# Patient Record
Sex: Male | Born: 1990 | Race: Black or African American | Hispanic: No | Marital: Single | Smoking: Never smoker
Health system: Southern US, Community
[De-identification: ages and names within clinical notes are randomized; demographics above are authoritative.]

## PROBLEM LIST (undated history)

## (undated) DIAGNOSIS — S83004A Unspecified dislocation of right patella, initial encounter: Secondary | ICD-10-CM

---
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
## 2016-03-30 ENCOUNTER — Ambulatory Visit
Admission: RE | Admit: 2016-03-30 | Discharge: 2016-03-30 | Disposition: A | Discharge status: Home / Self Care | Payer: BLUE CROSS/BLUE SHIELD | Source: Ambulatory Visit | Attending: Physician Assistant | Admitting: Physician Assistant

## 2016-03-30 ENCOUNTER — Other Ambulatory Visit: Payer: Self-pay | Admitting: Physician Assistant

## 2016-03-30 DIAGNOSIS — M25569 Pain in unspecified knee: Secondary | ICD-10-CM | POA: Diagnosis not present

## 2016-03-30 DIAGNOSIS — R52 Pain, unspecified: Secondary | ICD-10-CM

## 2016-03-30 DIAGNOSIS — M25561 Pain in right knee: Secondary | ICD-10-CM | POA: Diagnosis not present

## 2016-07-05 DIAGNOSIS — D492 Neoplasm of unspecified behavior of bone, soft tissue, and skin: Secondary | ICD-10-CM | POA: Diagnosis not present

## 2016-07-17 DIAGNOSIS — D1801 Hemangioma of skin and subcutaneous tissue: Secondary | ICD-10-CM | POA: Diagnosis not present

## 2017-03-09 ENCOUNTER — Ambulatory Visit (HOSPITAL_COMMUNITY)
Admission: EM | Admit: 2017-03-09 | Discharge: 2017-03-09 | Disposition: A | Discharge status: Home / Self Care | Payer: BLUE CROSS/BLUE SHIELD | Attending: Internal Medicine | Admitting: Internal Medicine

## 2017-03-09 ENCOUNTER — Encounter (HOSPITAL_COMMUNITY): Payer: Self-pay | Admitting: Emergency Medicine

## 2017-03-09 DIAGNOSIS — R0789 Other chest pain: Secondary | ICD-10-CM

## 2017-03-09 MED ORDER — NAPROXEN 500 MG PO TABS: 500.0000 mg | ORAL_TABLET | Freq: Two times a day (BID) | ORAL | 0 refills | Status: AC

## 2017-03-09 NOTE — Discharge Instructions (Signed)
Your symptoms could be due to muscle strain. Start Naproxen as directed. Ice/heat compresses as needed. This can take up to 3-4 weeks to completely resolve, but you should be feeling better each week. Symptoms could also be due to anxiety, establish PCP care for further evaluation and treatment needed.

## 2017-03-09 NOTE — ED Triage Notes (Signed)
Pt here for intermittent centered CP that radiates to the left side onset 3 days  Nothing makes the pain worse  Denies n/v, diaphoresis, HA, blurred vision, weakness, inj/trauma  Lifts heavy obj at work   Going through family stressors at home.   A&O x4... NAD... Ambulatory

## 2017-03-09 NOTE — ED Provider Notes (Signed)
[facility]    CSN: [id number] Arrival date & time: 03/09/17  1523     History   Chief Complaint Chief Complaint  Patient presents with  . Chest Pain    HPI Gary Townsend is a 26 y.o. male.   26 year old healthy male comes in with 3 day history of left sided chest pain that radiates to the center of the chest and migrates back and forth. Patient states pain started while having heated conversation with family member. Pain is intermittent and describes the pain as pins and needles, and feels that he needs to hyperventilate. Slowing his breathing with deep breaths helps with his symptoms. Patient lifts heavy objects for work and states has needed more effort with the left arm to move objects. Denies exacerbation of chest pain with heavy lifting or eating. Denies shortness of breath, palpitations, weakness, dizziness, syncope. Denies abdominal pain, nausea, vomiting. Felt anxious due to family conditions. Denies family history of heart disease, hypertension, diabetes.       History reviewed. No pertinent past medical history.  There are no active problems to display for this patient.   History reviewed. No pertinent surgical history.     Home Medications    Prior to Admission medications   Medication Sig Start Date End Date Taking? Authorizing Provider  naproxen (NAPROSYN) 500 MG tablet Take 1 tablet (500 mg total) by mouth 2 (two) times daily. 03/09/17 03/19/17  Belinda Fisher, PA-C    Family History History reviewed. No pertinent family history.  Social History Social History  Substance Use Topics  . Smoking status: Never Smoker  . Smokeless tobacco: Never Used  . Alcohol use No     Allergies   Patient has no known allergies.   Review of Systems Review of Systems  Reason unable to perform ROS: See HPI as above.     Physical Exam Triage Vital Signs ED Triage Vitals [03/09/17 1613]  Enc Vitals Group     BP 117/69     Pulse Rate 73     Resp 20   Temp 99.1 F (37.3 C)     Temp Source Oral     SpO2 100 %     Weight      Height      Head Circumference      Peak Flow      Pain Score      Pain Loc      Pain Edu?      Excl. in GC?    No data found.   Updated Vital Signs BP 117/69 (BP Location: Left Arm)   Pulse 73   Temp 99.1 F (37.3 C) (Oral)   Resp 20   SpO2 100%   Physical Exam  Constitutional: He is oriented to person, place, and time. He appears well-developed and well-nourished. No distress.  HENT:  Head: Normocephalic and atraumatic.  Eyes: Pupils are equal, round, and reactive to light. Conjunctivae are normal.  Cardiovascular: Normal rate, regular rhythm and normal heart sounds.  Exam reveals no gallop and no friction rub.   No murmur heard. Pulmonary/Chest: Effort normal and breath sounds normal. He has no wheezes. He has no rales. He exhibits tenderness (Left chest tenderness on palpation.).  Musculoskeletal:  No tenderness on palpation of the shoulder, elbow.Full range of motion. Strength normal and equal bilaterally. Sensation intact and equal bilaterally.  Radial pulses 2+ and equal bilaterally. Capillary refill less than 2 seconds.   Neurological: He is alert and oriented to  person, place, and time. He has normal strength. He is not disoriented. No sensory deficit. GCS eye subscore is 4. GCS verbal subscore is 5. GCS motor subscore is 6.  Skin: Skin is warm and dry.     UC Treatments / Results  Labs (all labs ordered are listed, but only abnormal results are displayed) Labs Reviewed - No data to display  EKG  EKG Interpretation None       Radiology No results found.  Procedures Procedures (including critical care time)  Medications Ordered in UC Medications - No data to display   Initial Impression / Assessment and Plan / UC Course  I have reviewed the triage vital signs and the nursing notes.  Pertinent labs & imaging results that were available during my care of the patient were  reviewed by me and considered in my medical decision making (see chart for details).    Discussed with patient given chest pain is reproducible by palpation, likely due to muscle strain from heavy lifting. Discussed with patient symptoms could also be caused by anxiety given onset of symptoms was heated discussion. Start NSAID as directed for pain and inflammation. Ice/heat compresses. Discussed with patient strain can take up to 3-4 weeks to resolve, but should be getting better each week. Patient to establish PCP For further management of anxiety as needed. Resources given, information on anxiety given. Return precautions given.   Final Clinical Impressions(s) / UC Diagnoses   Final diagnoses:  Chest wall pain    New Prescriptions New Prescriptions   NAPROXEN (NAPROSYN) 500 MG TABLET    Take 1 tablet (500 mg total) by mouth 2 (two) times daily.       Belinda Fisher, PA-C 03/09/17 1711

## 2017-07-21 DIAGNOSIS — N451 Epididymitis: Secondary | ICD-10-CM | POA: Diagnosis not present

## 2017-07-21 DIAGNOSIS — N50812 Left testicular pain: Secondary | ICD-10-CM | POA: Diagnosis not present

## 2017-07-23 DIAGNOSIS — N451 Epididymitis: Secondary | ICD-10-CM | POA: Diagnosis not present

## 2017-12-07 DIAGNOSIS — L980 Pyogenic granuloma: Secondary | ICD-10-CM | POA: Diagnosis not present

## 2017-12-25 DIAGNOSIS — Z23 Encounter for immunization: Secondary | ICD-10-CM | POA: Diagnosis not present

## 2017-12-25 DIAGNOSIS — Z0000 Encounter for general adult medical examination without abnormal findings: Secondary | ICD-10-CM | POA: Diagnosis not present

## 2017-12-25 DIAGNOSIS — Z13220 Encounter for screening for lipoid disorders: Secondary | ICD-10-CM | POA: Diagnosis not present

## 2017-12-25 DIAGNOSIS — Z131 Encounter for screening for diabetes mellitus: Secondary | ICD-10-CM | POA: Diagnosis not present

## 2018-01-23 DIAGNOSIS — L980 Pyogenic granuloma: Secondary | ICD-10-CM | POA: Diagnosis not present

## 2018-04-03 DIAGNOSIS — B349 Viral infection, unspecified: Secondary | ICD-10-CM | POA: Diagnosis not present

## 2018-04-03 DIAGNOSIS — J029 Acute pharyngitis, unspecified: Secondary | ICD-10-CM | POA: Diagnosis not present

## 2018-08-01 DIAGNOSIS — J029 Acute pharyngitis, unspecified: Secondary | ICD-10-CM | POA: Diagnosis not present

## 2018-08-01 DIAGNOSIS — R509 Fever, unspecified: Secondary | ICD-10-CM | POA: Diagnosis not present

## 2018-08-01 DIAGNOSIS — R52 Pain, unspecified: Secondary | ICD-10-CM | POA: Diagnosis not present

## 2018-10-02 DIAGNOSIS — L980 Pyogenic granuloma: Secondary | ICD-10-CM | POA: Diagnosis not present

## 2019-02-05 ENCOUNTER — Emergency Department
Admission: EM | Admit: 2019-02-05 | Discharge: 2019-02-05 | Disposition: A | Discharge status: Home / Self Care | Payer: BC Managed Care – PPO | Source: Home / Self Care

## 2019-02-05 ENCOUNTER — Other Ambulatory Visit: Payer: Self-pay

## 2019-02-05 ENCOUNTER — Emergency Department (INDEPENDENT_AMBULATORY_CARE_PROVIDER_SITE_OTHER): Payer: BC Managed Care – PPO

## 2019-02-05 ENCOUNTER — Encounter: Payer: Self-pay | Admitting: *Deleted

## 2019-02-05 DIAGNOSIS — S91115A Laceration without foreign body of left lesser toe(s) without damage to nail, initial encounter: Secondary | ICD-10-CM | POA: Diagnosis not present

## 2019-02-05 DIAGNOSIS — M79675 Pain in left toe(s): Secondary | ICD-10-CM

## 2019-02-05 DIAGNOSIS — S99922A Unspecified injury of left foot, initial encounter: Secondary | ICD-10-CM | POA: Diagnosis not present

## 2019-02-05 DIAGNOSIS — M79672 Pain in left foot: Secondary | ICD-10-CM

## 2019-02-05 DIAGNOSIS — S93505A Unspecified sprain of left lesser toe(s), initial encounter: Secondary | ICD-10-CM

## 2019-02-05 NOTE — Discharge Instructions (Signed)
°  You may take 500mg  acetaminophen every 4-6 hours or in combination with ibuprofen 400-600mg  every 6-8 hours as needed for pain and inflammation.  You should change your bandage 2-3 times daily but can keep your bandage off if at home in your house.  Keep wound clean and dry. You may buddy tape your two toes together to help with pain.  Please follow up with Sports Medicine in 1-2 weeks if not improving.

## 2019-02-05 NOTE — ED Triage Notes (Signed)
Pt c/o LT 3 rd toe pain x 1 day after hitting it on the stairwell at home.

## 2019-02-05 NOTE — ED Provider Notes (Signed)
[facility]    CSN: [id number] Arrival date & time: 02/05/19  1112      History   Chief Complaint Chief Complaint  Patient presents with   Foot Pain    HPI Gary Townsend is a 28 y.o. male.   HPI  Gary Townsend is a 28 y.o. male presenting to UC with c/o Left middle toe pain and wound that occurred last night after hitting his Left third toe on his stairs last night.  He notes his toe "popped out" and he was able to "pop it back in" but has continued to have pain/soreness. He has to wear steel toed shoes at work, which makes the wound hurt more despite wearing two bandages on his toe. The nurse at work encouraged him to be seen today.  No medication taken PTA>    History reviewed. No pertinent past medical history.  There are no active problems to display for this patient.   History reviewed. No pertinent surgical history.     Home Medications    Prior to Admission medications   Not on File    Family History History reviewed. No pertinent family history.  Social History Social History   Tobacco Use   Smoking status: Never Smoker   Smokeless tobacco: Never Used  Substance Use Topics   Alcohol use: No   Drug use: No     Allergies   Patient has no known allergies.   Review of Systems Review of Systems  Musculoskeletal: Positive for arthralgias. Negative for joint swelling.  Skin: Positive for wound. Negative for color change.  Neurological: Negative for weakness and numbness.     Physical Exam Triage Vital Signs ED Triage Vitals  Enc Vitals Group     BP      Pulse      Resp      Temp      Temp src      SpO2      Weight      Height      Head Circumference      Peak Flow      Pain Score      Pain Loc      Pain Edu?      Excl. in GC?    No data found.  Updated Vital Signs BP 105/70 (BP Location: Right Arm)    Pulse 76    Temp 98.3 F (36.8 C) (Oral)    Resp 18    Ht 5\' 7"  (1.702 m)    Wt 208 lb (94.3 kg)    SpO2 98%     BMI 32.58 kg/m   Visual Acuity Right Eye Distance:   Left Eye Distance:   Bilateral Distance:    Right Eye Near:   Left Eye Near:    Bilateral Near:     Physical Exam Vitals signs and nursing note reviewed.  Constitutional:      Appearance: Normal appearance. He is well-developed.  HENT:     Head: Normocephalic and atraumatic.  Neck:     Musculoskeletal: Normal range of motion.  Cardiovascular:     Rate and Rhythm: Normal rate.  Pulmonary:     Effort: Pulmonary effort is normal.  Musculoskeletal: Normal range of motion.        General: Tenderness present. No swelling or deformity.     Comments: Left middle to: mild tenderness, no edema or deformity. Full ROM  Skin:    General: Skin is warm and dry.  Capillary Refill: Capillary refill takes less than 2 seconds.     Comments: Left middle toe: superficial laceration to tip of toe. No nailbed involvement. No active bleeding. No foreign bodies seen or palpated.   Neurological:     Mental Status: He is alert and oriented to person, place, and time.  Psychiatric:        Behavior: Behavior normal.      UC Treatments / Results  Labs (all labs ordered are listed, but only abnormal results are displayed) Labs Reviewed - No data to display  EKG   Radiology Dg Foot Complete Left  Result Date: 02/05/2019 CLINICAL DATA:  Pt hit his left 3rd toe on the stair last night and states he popped back in the joint EXAM: LEFT FOOT - COMPLETE 3+ VIEW COMPARISON:  None. FINDINGS: There is no evidence of fracture or dislocation. Normal alignment. There is no evidence of arthropathy or other focal bone abnormality. Soft tissues are unremarkable. IMPRESSION: No evidence of fracture or dislocation in the left foot, specifically the third toe. Electronically Signed   By: Audie Pinto M.D.   On: 02/05/2019 11:49    Procedures Procedures (including critical care time)  Medications Ordered in UC Medications - No data to  display  Initial Impression / Assessment and Plan / UC Course  I have reviewed the triage vital signs and the nursing notes.  Pertinent labs & imaging results that were available during my care of the patient were reviewed by me and considered in my medical decision making (see chart for details).     Reviewed imaging with pt. Reassured no fracture or dislocation Wound does not need closing. Wound re-bandaged.  Due to having to wear steel toed shoes, work note provided for today and tomorrow to allow toe to heal a little more AVS provided.  Final Clinical Impressions(s) / UC Diagnoses   Final diagnoses:  Laceration of third toe of left foot, initial encounter  Toe pain, left  Sprain of third toe of left foot, initial encounter     Discharge Instructions      You may take 500mg  acetaminophen every 4-6 hours or in combination with ibuprofen 400-600mg  every 6-8 hours as needed for pain and inflammation.  You should change your bandage 2-3 times daily but can keep your bandage off if at home in your house.  Keep wound clean and dry. You may buddy tape your two toes together to help with pain.  Please follow up with Sports Medicine in 1-2 weeks if not improving.      ED Prescriptions    None     Controlled Substance Prescriptions [place] Controlled Substance Registry consulted? Not Applicable   Tyrell Antonio 02/05/19 1310

## 2019-02-20 ENCOUNTER — Ambulatory Visit (HOSPITAL_COMMUNITY)
Admission: EM | Admit: 2019-02-20 | Discharge: 2019-02-20 | Disposition: A | Discharge status: Home / Self Care | Payer: BC Managed Care – PPO | Attending: Family Medicine | Admitting: Family Medicine

## 2019-02-20 ENCOUNTER — Encounter (HOSPITAL_COMMUNITY): Payer: Self-pay

## 2019-02-20 ENCOUNTER — Other Ambulatory Visit: Payer: Self-pay

## 2019-02-20 DIAGNOSIS — B349 Viral infection, unspecified: Secondary | ICD-10-CM | POA: Diagnosis not present

## 2019-02-20 DIAGNOSIS — R51 Headache: Secondary | ICD-10-CM

## 2019-02-20 DIAGNOSIS — R0989 Other specified symptoms and signs involving the circulatory and respiratory systems: Secondary | ICD-10-CM

## 2019-02-20 DIAGNOSIS — J029 Acute pharyngitis, unspecified: Secondary | ICD-10-CM

## 2019-02-20 DIAGNOSIS — Z20828 Contact with and (suspected) exposure to other viral communicable diseases: Secondary | ICD-10-CM | POA: Diagnosis not present

## 2019-02-20 DIAGNOSIS — R6889 Other general symptoms and signs: Secondary | ICD-10-CM | POA: Diagnosis not present

## 2019-02-20 DIAGNOSIS — R05 Cough: Secondary | ICD-10-CM

## 2019-02-20 DIAGNOSIS — Z20822 Contact with and (suspected) exposure to covid-19: Secondary | ICD-10-CM

## 2019-02-20 DIAGNOSIS — M791 Myalgia, unspecified site: Secondary | ICD-10-CM

## 2019-02-20 LAB — POCT RAPID STREP A: Streptococcus, Group A Screen (Direct): NEGATIVE

## 2019-02-20 MED ORDER — DEXAMETHASONE 6 MG PO TABS: 6.0000 mg | ORAL_TABLET | Freq: Every day | ORAL | 0 refills | Status: DC

## 2019-02-20 NOTE — ED Provider Notes (Addendum)
[facility]    CSN: [id number] Arrival date & time: 02/20/19  5732      History   Chief Complaint Chief Complaint  Patient presents with  . Sore Throat  . Generalized Body Aches    HPI Gary Townsend is a 28 y.o. male.   Subjective:   Gary Townsend is a 28 y.o. male who presents for evaluation of headache, myalgias, sore throat, cough, chest tightness and dizziness. Symptoms have been present for 1 day. He denies any fevers, chills, nausea, vomiting, diarrhea or headache. He has not tried anything to alleviate his symptoms. He denies any known COVID exposure. His fiance is sick with similar symptoms.   The following portions of the patient's history were reviewed and updated as appropriate: allergies, current medications, past family history, past medical history, past social history, past surgical history and problem list.        History reviewed. No pertinent past medical history.  There are no active problems to display for this patient.   History reviewed. No pertinent surgical history.     Home Medications    Prior to Admission medications   Medication Sig Start Date End Date Taking? Authorizing Provider  dexamethasone (DECADRON) 6 MG tablet Take 1 tablet (6 mg total) by mouth daily. 02/20/19   Lurline Idol, FNP    Family History Family History  Problem Relation Age of Onset  . Healthy Mother   . Healthy Father     Social History Social History   Tobacco Use  . Smoking status: Never Smoker  . Smokeless tobacco: Never Used  Substance Use Topics  . Alcohol use: No  . Drug use: No     Allergies   Patient has no known allergies.   Review of Systems Review of Systems  Constitutional: Negative for appetite change, chills, diaphoresis, fatigue and fever.  HENT: Positive for sore throat.   Respiratory: Positive for cough. Negative for shortness of breath and wheezing.   Cardiovascular: Positive for chest pain. Negative for  palpitations.  Gastrointestinal: Negative for diarrhea, nausea and vomiting.  Musculoskeletal: Positive for myalgias.  Neurological: Positive for dizziness. Negative for headaches.  All other systems reviewed and are negative.    Physical Exam Triage Vital Signs ED Triage Vitals  Enc Vitals Group     BP 02/20/19 0830 110/72     Pulse Rate 02/20/19 0830 84     Resp 02/20/19 0830 16     Temp 02/20/19 0830 99.6 F (37.6 C)     Temp Source 02/20/19 0830 Oral     SpO2 02/20/19 0830 100 %     Weight --      Height --      Head Circumference --      Peak Flow --      Pain Score 02/20/19 0828 2     Pain Loc --      Pain Edu? --      Excl. in GC? --    No data found.  Updated Vital Signs BP 110/72 (BP Location: Right Arm)   Pulse 84   Temp 99.6 F (37.6 C) (Oral)   Resp 16   SpO2 100%   Visual Acuity Right Eye Distance:   Left Eye Distance:   Bilateral Distance:    Right Eye Near:   Left Eye Near:    Bilateral Near:     Physical Exam Vitals signs reviewed.  Constitutional:      Appearance: He is well-developed. He  is not ill-appearing.  HENT:     Head: Normocephalic.     Mouth/Throat:     Lips: Pink.     Mouth: Mucous membranes are moist.     Pharynx: Posterior oropharyngeal erythema present.  Eyes:     Conjunctiva/sclera: Conjunctivae normal.     Pupils: Pupils are equal, round, and reactive to light.  Neck:     Musculoskeletal: Normal range of motion and neck supple.  Cardiovascular:     Rate and Rhythm: Normal rate and regular rhythm.  Pulmonary:     Effort: Pulmonary effort is normal.     Breath sounds: Normal breath sounds.  Lymphadenopathy:     Cervical: No cervical adenopathy.  Skin:    General: Skin is warm and dry.  Neurological:     General: No focal deficit present.     Mental Status: He is alert and oriented to person, place, and time.  Psychiatric:        Mood and Affect: Mood normal.      UC Treatments / Results  Labs (all labs  ordered are listed, but only abnormal results are displayed) Labs Reviewed  NOVEL CORONAVIRUS, NAA (HOSP ORDER, SEND-OUT TO REF LAB; TAT 18-24 HRS)  CULTURE, GROUP A STREP [facility])  POCT RAPID STREP A    EKG   Radiology No results found.  Procedures Procedures (including critical care time)  Medications Ordered in UC Medications - No data to display  Initial Impression / Assessment and Plan / UC Course  I have reviewed the triage vital signs and the nursing notes.  Pertinent labs & imaging results that were available during my care of the patient were reviewed by me and considered in my medical decision making (see chart for details).    28 yo male with no significant medical history presenting with a one-day history of worsening  headache, myalgias, sore throat, cough, chest tightness and dizziness. He denies any known exposure to COVID but his fiance is also sick with similar symptoms. Rapid strep is negative. COVID test pending.   Plan:  Supportive care with appropriate antipyretics and fluids. Take medications as prescribed  Patient should remain in home isolation (quarantine) until notified of their COVID-19 results; further recommendations will be made based upon the results.  The patient should monitor for any developing or worsening symptoms. If serious symptoms develop, the patient should return here for evaluation, contact their health care provider or go to the nearest emergency department immediately.    Today's evaluation has revealed no signs of a dangerous process. Discussed diagnosis with patient and/or guardian. Patient and/or guardian aware of their diagnosis, possible red flag symptoms to watch out for and need for close follow up. Patient and/or guardian understands verbal and written discharge instructions. Patient and/or guardian comfortable with plan and disposition.  Patient and/or guardian has a clear mental status at this time, good insight into illness  (after discussion and teaching) and has clear judgment to make decisions regarding their care  This care was provided during an unprecedented National Emergency due to the Novel Coronavirus (COVID-19) pandemic. COVID-19 infections and transmission risks place heavy strains on healthcare resources.  As this pandemic evolves, our facility, providers, and staff strive to respond fluidly, to remain operational, and to provide care relative to available resources and information. Outcomes are unpredictable and treatments are without well-defined guidelines. Further, the impact of COVID-19 on all aspects of urgent care, including the impact to patients seeking care for reasons other than COVID-19,  is unavoidable during this national emergency. At this time of the global pandemic, management of patients has significantly changed, even for non-COVID positive patients given high local and regional COVID volumes at this time requiring high healthcare system and resource utilization. The standard of care for management of both COVID suspected and non-COVID suspected patients continues to change rapidly at the local, regional, national, and global levels. This patient was worked up and treated to the best available but ever changing evidence and resources available at this current time.   Documentation was completed with the aid of voice recognition software. Transcription may contain typographical errors.   Final Clinical Impressions(s) / UC Diagnoses   Final diagnoses:  Suspected Covid-19 Virus Infection  Viral syndrome     Discharge Instructions     Take medications as prescribed.  Supportive care with tylenol and/or ibuprofen, rest and fluids. You should remain in home isolation (quarantine) until notified of your COVID-19 results. Further recommendations will be made based upon these results. You will only be called if your COVID test is POSITIVE. You should go to MyChart to review results of any testing  done here today. Monitor for any developing or worsening symptoms. If any serious symptoms develop, you should return here for evaluation, contact your health care provider or go to the nearest emergency department immediately.      ED Prescriptions    Medication Sig Dispense Auth. Provider   dexamethasone (DECADRON) 6 MG tablet Take 1 tablet (6 mg total) by mouth daily. 5 tablet Enrique Sack, FNP     Controlled Substance Prescriptions [place] Controlled Substance Registry consulted? Not Applicable   Enrique Sack, [place] 02/20/19 [place], [place], [place] 02/20/19 [phone]

## 2019-02-20 NOTE — ED Triage Notes (Signed)
Patient presents to Urgent Care with complaints of sore throat and generalized body aches since last night. Patient reports fiance has been sick too but with different symptoms.

## 2019-02-20 NOTE — Discharge Instructions (Addendum)
Take medications as prescribed.  Supportive care with tylenol and/or ibuprofen, rest and fluids. You should remain in home isolation (quarantine) until notified of your COVID-19 results. Further recommendations will be made based upon these results. You will only be called if your COVID test is POSITIVE. You should go to MyChart to review results of any testing done here today. Monitor for any developing or worsening symptoms. If any serious symptoms develop, you should return here for evaluation, contact your health care provider or go to the nearest emergency department immediately.

## 2019-02-22 LAB — CULTURE, GROUP A STREP (THRC)

## 2019-02-22 LAB — NOVEL CORONAVIRUS, NAA (HOSP ORDER, SEND-OUT TO REF LAB; TAT 18-24 HRS): SARS-CoV-2, NAA: NOT DETECTED

## 2019-02-24 ENCOUNTER — Encounter (HOSPITAL_COMMUNITY): Payer: Self-pay

## 2019-03-05 ENCOUNTER — Ambulatory Visit (INDEPENDENT_AMBULATORY_CARE_PROVIDER_SITE_OTHER): Payer: BC Managed Care – PPO | Admitting: Family Medicine

## 2019-03-05 ENCOUNTER — Other Ambulatory Visit: Payer: Self-pay

## 2019-03-05 ENCOUNTER — Ambulatory Visit (INDEPENDENT_AMBULATORY_CARE_PROVIDER_SITE_OTHER): Payer: BC Managed Care – PPO

## 2019-03-05 ENCOUNTER — Encounter: Payer: Self-pay | Admitting: Family Medicine

## 2019-03-05 ENCOUNTER — Other Ambulatory Visit: Payer: Self-pay | Admitting: Family Medicine

## 2019-03-05 VITALS — BP 105/69 | HR 69 | Temp 98.6°F | Wt 213.0 lb

## 2019-03-05 DIAGNOSIS — M25561 Pain in right knee: Secondary | ICD-10-CM

## 2019-03-05 DIAGNOSIS — M25562 Pain in left knee: Secondary | ICD-10-CM | POA: Diagnosis not present

## 2019-03-05 DIAGNOSIS — S83001A Unspecified subluxation of right patella, initial encounter: Secondary | ICD-10-CM | POA: Diagnosis not present

## 2019-03-05 MED ORDER — DICLOFENAC SODIUM 1 % TD GEL: 4.0000 g | Freq: Four times a day (QID) | TRANSDERMAL | 11 refills | Status: AC

## 2019-03-05 MED ORDER — NAPROXEN 500 MG PO TABS: 500.0000 mg | ORAL_TABLET | Freq: Two times a day (BID) | ORAL | 1 refills | Status: AC | PRN

## 2019-03-05 NOTE — Progress Notes (Signed)
Subjective:    CC: Right knee pain  HPI:  Patient was at work on Saturday, September 19.  He was stepping down and felt pain in the anterior and lateral knee.  He felt as though his kneecap almost dislocated.  The pain worsened on Monday, September 21 and has been unable to work.  Pain is worse with prolonged standing and activity.  He denies further episodes of patella subluxation.  He has a history of in 2011 having a patella dislocation that he reduced by himself.  He notes it has happened a few times since then.  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  He thinks he can probably return to work tomorrow with normal duties.  Past medical history, Surgical history, Family history not pertinant except as noted below, Social history, Allergies, and medications have been entered into the medical record, reviewed, and no changes needed.   Review of Systems: No headache, visual changes, nausea, vomiting, diarrhea, constipation, dizziness, abdominal pain, skin rash, fevers, chills, night sweats, weight loss, swollen lymph nodes, body aches, joint swelling, muscle aches, chest pain, shortness of breath, mood changes, visual or auditory hallucinations.   Objective:    Vitals:   03/05/19 0934  BP: 105/69  Pulse: 69  Temp: 98.6 F (37 C)   General: Well Developed, well nourished, and in no acute distress.  Neuro/Psych: Alert and oriented x3, extra-ocular muscles intact, able to move all 4 extremities, sensation grossly intact. Skin: Warm and dry, no rashes noted.  Respiratory: Not using accessory muscles, speaking in full sentences, trachea midline.  Cardiovascular: Pulses palpable, no extremity edema. Abdomen: Does not appear distended. MSK: Right knee normal-appearing with no effusion or deformity. Range of motion 0-120 degrees. Mildly tender palpation lateral joint line. Slight laxity to anterior drawer testing.  No laxity to valgus or varus test.  Negative laxity to  posterior drawer testing. Negative McMurray's test. Negative patellar apprehension test. Intact flexion extension strength.    Lab and Radiology Results X-ray images right knee obtained today personally independently reviewed Slightly high riding patella compared to contralateral left knee images.  Sunrise views not very remarkable.  Minimal degenerative changes.  No acute fractures. Await formal radiology review  Impression and Recommendations:    Assessment and Plan: 28 y.o. male with right knee pain.  Patient has exacerbation of what seems like a chronic issue.  He describes episodes of patellar dislocation or patella subluxation.  However his exam today has negative patellar apprehension test.  We discussed treatment options.  Plan for physical therapy, diclofenac gel, naproxen, and patellar stabilizing brace.  He may benefit from Kinesiotape with physical therapy.  Recheck in 2 to 4 weeks.  If not improving next that may be injection.  Ultimately MRI may be helpful..  Work note provided.  Will complete FMLA form and inform patient as well.  PDMP not reviewed this encounter. Orders Placed This Encounter  Procedures  . DG Knee Complete 4 Views Left    Please include patellar sunrise, lateral, and weightbearing bilateral AP and bilateral rosenberg views    Standing Status:   Future    Number of Occurrences:   1    Standing Expiration Date:   05/04/2020    Order Specific Question:   Reason for exam:    Answer:   Please include patellar sunrise, lateral, and weightbearing bilateral AP and bilateral rosenberg views    Comments:   Please include patellar sunrise, lateral, and weightbearing bilateral AP and bilateral rosenberg views  Order Specific Question:   Preferred imaging location?    Answer:   [facility]  . Ambulatory referral to Physical Therapy    Referral Priority:   Routine    Referral Type:   Physical Medicine    Referral Reason:   Specialty Services Required     Requested Specialty:   Physical Therapy   Meds ordered this encounter  Medications  . diclofenac sodium (VOLTAREN) 1 % GEL    Sig: Apply 4 g topically 4 (four) times daily. To affected joint.    Dispense:  100 g    Refill:  11  . naproxen (NAPROSYN) 500 MG tablet    Sig: Take 1 tablet (500 mg total) by mouth 2 (two) times daily as needed for moderate pain.    Dispense:  60 tablet    Refill:  1    Discussed warning signs or symptoms. Please see discharge instructions. Patient expresses understanding.

## 2019-03-05 NOTE — Patient Instructions (Addendum)
Thank you for coming in today. Apply the diclofenac gel 4x daily for pain as needed.  Use Naproxen 2x daily as needed.  Use the brace with activity.  Recheck in 2-4 weeks. If all better do not need follow up.    Patellar Dislocation and Subluxation The kneecap (patella) is located in a groove at the end of the thigh bone (femur). Patellar dislocation and patellar subluxation are injuries that happen when the patella slips out of its normal position.  In a patellar subluxation, the patella slips partly out of the groove.  In a patellar dislocation, the patella slips all the way out of the groove. What are the causes? This condition may be caused by:  A hit to the knee.  Twisting the knee when the foot is planted. What increases the risk? You are more likely to develop this condition if you:  Are an athlete in your teens or 54s.  Have had this condition before.  Play certain kinds of sports, including sports that: ? Include quick turns, quick changes in direction, or contact with other players, like soccer. ? Require jumping, such as basketball or volleyball. ? Require that cleats are worn. What are the signs or symptoms? Symptoms of this condition include:  A feeling that the knee is buckling, followed by sudden extreme pain in the knee.  A deformed knee.  A popping sensation, followed by a feeling that something is out of place.  Inability to bend or straighten the knee.  Swelling in the knee. How is this diagnosed? This condition may be diagnosed with:  A physical exam.  An X-ray. This may be done to see the position of the patella or to see if a bone has broken.  An MRI. This may be done to look at the alignment of your knee and the ligaments that hold your patella in place. How is this treated? Your patella may move back into place on its own when you straighten your knee. If your patella does not move back into place on its own, your health care provider will  move it back into place. After your patella is back in its normal position, you may be able to go back to your normal activities, or you may be treated further by:  Wearing a knee brace to keep your knee from moving (immobilized) while it heals.  Doing exercises that help improve strength and movement in your knee.  Taking medicine to help with pain and inflammation.  Applying ice to the knee to help with pain and inflammation.  Having surgery to prevent the patella from slipping out of place or to clean out any loose cartilage in your joint. This may be needed if other treatments do not help or if the condition keeps happening. Follow these instructions at home: If you have a brace:  Wear the brace as told by your health care provider. Remove it only as told by your health care provider.  Loosen the brace if your toes tingle, become numb, or turn cold and blue.  Keep the brace clean.  If the brace is not waterproof: ? Do not let it get wet. ? Cover it with a watertight covering when you take a bath or shower. Managing pain, stiffness, and swelling   If directed, put ice on the affected area. ? If you have a removable brace, remove it as told by your health care provider. ? Put ice in a plastic bag. ? Place a towel between your skin  and the bag. ? Leave the ice on for 20 minutes, 2-3 times a day.  Move your toes often to reduce stiffness and swelling.  Raise (elevate) the injured area above the level of your heart while you are sitting or lying down. Activity  Do not use the injured limb to support your body weight until your health care provider says that you can. Use crutches as told by your health care provider.  Return to your normal activities as told by your health care provider. Ask your health care provider what activities are safe for you.  Do exercises as told by your health care provider. General instructions  Take over-the-counter and prescription medicines  only as told by your health care provider.  Do not use any products that contain nicotine or tobacco, such as cigarettes, e-cigarettes, and chewing tobacco. These can delay healing. If you need help quitting, ask your health care provider.  Keep all follow-up visits as told by your health care provider. This is important. How is this prevented?  Warm up and stretch before being active.  Cool down and stretch after being active.  Give your body time to rest between periods of activity.  Make sure to use equipment that fits you.  Be safe and responsible while being active. This will help you avoid falls.  Do at least 150 minutes of moderate-intensity exercise each week, such as brisk walking or water aerobics.  Maintain physical fitness, including: ? Strength. ? Flexibility. ? Cardiovascular fitness. ? Endurance. Contact a health care provider if:  The pain in your knee gets worse and is not relieved by medicine.  Your knee catches or locks. Get help right away if:  Your patella slips out of its normal position again.  The swelling in your knee gets worse. Summary  Patellar dislocation and patellar subluxation are injuries that happen when the patella slips out of its normal position.  If your patella does not move back into place on its own, your health care provider will move it back into place.  Return to your normal activities as told by your health care provider. Ask your health care provider what activities are safe for you.  Do not use the injured limb to support your body weight until your health care provider says that you can. Use crutches as told by your health care provider.  Keep all follow-up visits as told by your health care provider. This is important. This information is not intended to replace advice given to you by your health care provider. Make sure you discuss any questions you have with your health care provider. Document Released: 05/29/2005  Document Revised: 09/24/2018 Document Reviewed: 04/22/2018 Elsevier Patient Education  2020 [employer].

## 2019-03-11 ENCOUNTER — Encounter: Payer: Self-pay | Admitting: Physical Therapy

## 2019-03-11 ENCOUNTER — Ambulatory Visit (INDEPENDENT_AMBULATORY_CARE_PROVIDER_SITE_OTHER): Payer: BC Managed Care – PPO | Admitting: Physical Therapy

## 2019-03-11 ENCOUNTER — Other Ambulatory Visit: Payer: Self-pay

## 2019-03-11 DIAGNOSIS — R29898 Other symptoms and signs involving the musculoskeletal system: Secondary | ICD-10-CM

## 2019-03-11 DIAGNOSIS — M25561 Pain in right knee: Secondary | ICD-10-CM | POA: Diagnosis not present

## 2019-03-11 NOTE — Patient Instructions (Signed)
Access Code: [email or web address]  URL: [email or web address]  Date: 03/11/2019  Prepared by: Faustino Congress   Exercises  Straight Leg Raise with External Rotation - 10 reps - 1 sets - 2-3 sec hold - 3x daily - 7x weekly  Seated Long Arc Quad with Hip Adduction - 10 reps - 1 sets - 2-3 sec hold - 3x daily - 7x weekly  Sidelying Hip Extension in Abduction - 10 reps - 1 sets - 2-3 sec hold - 3x daily - 7x weekly  Patient Education  Trigger Point Dry Needling

## 2019-03-11 NOTE — Therapy (Signed)
[facility] [address] [place], [place], [address] Phone: [phone]   Fax:  [phone]  Physical Therapy Evaluation  Patient Details  Name: Gary ButtJalen D Townsend MRN: [id number] Date of Birth: [birth year] Referring Provider (PT): Rodolph Bongorey, Evan S, MD   Encounter Date: 03/11/2019  PT End of Session - 03/11/19 1012    Visit Number  1    Number of Visits  12    Date for PT Re-Evaluation  04/22/19    PT Start Time  0928    PT Stop Time  1002    PT Time Calculation (min)  34 min    Activity Tolerance  Patient tolerated treatment well    Behavior During Therapy  [facility] for tasks assessed/performed       Past Medical History:  Diagnosis Date  . Closed dislocation of right patella 2011    History reviewed. No pertinent surgical history.  There were no vitals filed for this visit.   Subjective Assessment - 03/11/19 0930    Subjective  Pt is a 28 y/o male who presents to OPPT with Rt knee pain.  Pt reports ~ 9 years ago, had episode of patellar subluxation.  He states since this initial episode he has had 5 other episodes, 1 happening yesterday.  Pt states he must occasionally reduce patella himself.    Limitations  Walking    How long can you walk comfortably?  difficulty with sharp turns    Patient Stated Goals  improve pain, reduce risk of subluxation    Currently in Pain?  Yes    Pain Score  0-No pain   up to 7/10   Pain Location  Knee    Pain Orientation  Right    Pain Descriptors / Indicators  Burning;Other (Comment)   stinging   Pain Type  Acute pain    Pain Onset  1 to 4 weeks ago    Pain Frequency  Intermittent    Aggravating Factors   sharp turns    Pain Relieving Factors  ice         [facility] - 03/11/19 0936      Assessment   Medical Diagnosis  M25.561 (ICD-10-CM) - Acute pain of right knee    Referring Provider (PT)  Rodolph Bongorey, Evan S, MD    Onset Date/Surgical Date  03/01/19    Hand Dominance   Left   Rt footed   Next MD Visit  03/19/2019    Prior Therapy  none      Precautions   Precautions  None    Required Braces or Orthoses  Other Brace/Splint    Other Brace/Splint  knee brace - wears when working or going somewhere with a lot of walking      Restrictions   Weight Bearing Restrictions  No      Balance Screen   Has the patient fallen in the past 6 months  No    Has the patient had a decrease in activity level because of a fear of falling?   No    Is the patient reluctant to leave their home because of a fear of falling?   No      Home Nurse, mental healthnvironment   Living Environment  Private residence    Living Arrangements  Spouse/significant other;Children   7 y/o stepson   Available Help at Discharge  Family    Type of Home  Apartment    Home Access  Stairs to enter  Entrance Stairs-Number of Steps  8    Entrance Stairs-Rails  Left    Home Layout  Two level;Bed/bath upstairs    Alternate Level Stairs-Number of Steps  16    Alternate Level Stairs-Rails  Left    Additional Comments  occasional difficulty with stairs when pain elevated      Prior Function   Level of Independence  Independent    Vocation  Full time employment    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, occasional lifting    Leisure  stay home; no regular exercise      Cognition   Overall Cognitive Status  Within Functional Limits for tasks assessed      Observation/Other Assessments   Focus on Therapeutic Outcomes (FOTO)   64 (36% limited; predicted 16% limited)      Posture/Postural Control   Posture/Postural Control  Postural limitations    Postural Limitations  Rounded Shoulders;Forward head      ROM / Strength   AROM / PROM / Strength  AROM;Strength      AROM   Overall AROM Comments  bil genu recurvatum; bil knee flexion WNL      Strength   Strength Assessment Site  Hip;Knee    Right/Left Hip  Right;Left    Right Hip Flexion  5/5    Right Hip Extension  4/5    Right Hip  External Rotation   4/5    Right Hip Internal Rotation  4/5    Right Hip ABduction  4/5    Right Hip ADduction  4/5    Left Hip Flexion  5/5    Left Hip Extension  5/5    Left Hip External Rotation  5/5    Left Hip Internal Rotation  5/5    Left Hip ABduction  5/5    Left Hip ADduction  5/5    Right/Left Knee  Right;Left    Right Knee Flexion  4/5    Right Knee Extension  5/5    Left Knee Flexion  5/5    Left Knee Extension  5/5      Palpation   Patella mobility  lateral tracking bil; Rt worse than Lt; hypermobility noted    Palpation comment  trigger points in Rt lateral quad; no significant tenderness along Rt knee      Special Tests    Special Tests  Knee Special Tests    Knee Special tests   other;other2      other    Comments  valgus stress negative Rt      other   Comments  anterior drawer negative on Rt                Objective measurements completed on examination: See above findings.      [facility] Adult PT Treatment/Exercise - 03/11/19 0936      Exercises   Exercises  Knee/Hip      Knee/Hip Exercises: Seated   Long Arc Quad  Right;5 reps    Long Arc Quad Limitations  with ball squeeze for VMO activation      Knee/Hip Exercises: Supine   Straight Leg Raise with External Rotation  Right;5 reps      Knee/Hip Exercises: Sidelying   Hip ABduction  Right;5 reps    Hip ABduction Limitations  with extension at end range             PT Education - 03/11/19 1012    Education Details  HEP  Person(s) Educated  Patient    Methods  Explanation;Demonstration;Handout    Comprehension  Verbalized understanding;Returned demonstration;Need further instruction          PT Long Term Goals - 03/11/19 1236      PT LONG TERM GOAL #1   Title  independent with HEP    Status  New    Target Date  04/22/19      PT LONG TERM GOAL #2   Title  FOTO score improved to 16% limited for improved function    Status  New    Target Date  04/22/19      PT  LONG TERM GOAL #3   Title  demonstrate at least 4+/5 RLE strength for improved function    Status  New    Target Date  04/22/19      PT LONG TERM GOAL #4   Title  report no episodes of subluxation for at least 2 weeks for improved function    Status  New    Target Date  04/22/19      PT LONG TERM GOAL #5   Title  report pain < 4/10 with activity for improved function    Status  New    Target Date  04/22/19             Plan - 03/11/19 1012    Clinical Impression Statement  Pt is a 28 y/o male who presents to OPPT for Rt knee pain and instability with patellar subluxations.  Pt demonstrates decreased strength, poor VMO activation and trigger points in lateral quad and lateral tracking of patella.  Pt will benefit from PT to address deficits listed.    Examination-Activity Limitations  Locomotion Level    Examination-Participation Restrictions  Community Activity;Other   work   Stability/Clinical Decision Making  Stable/Uncomplicated    Clinical Decision Making  Low    Rehab Potential  Good    PT Frequency  2x / week    PT Duration  6 weeks    PT Treatment/Interventions  ADLs/Self Care Home Management;Cryotherapy;Electrical Stimulation;Ultrasound;Moist Heat;Iontophoresis 4mg /ml Dexamethasone;Stair training;Gait training;Functional mobility training;Therapeutic activities;Therapeutic exercise;Patient/family education;Manual techniques;Passive range of motion;Vasopneumatic Device;Taping;Dry needling    PT Next Visit Plan  review HEP, progress VMO and glute med strengthening    PT Home Exercise Plan  Access Code: [email or web address]    Consulted and Agree with Plan of Care  Patient       Patient will benefit from skilled therapeutic intervention in order to improve the following deficits and impairments:  Abnormal gait, Difficulty walking, Increased fascial restricitons, Increased muscle spasms, Pain, Hypermobility, Decreased strength  Visit Diagnosis: Acute pain of right knee - Plan: PT  plan of care cert/re-cert  Other symptoms and signs involving the musculoskeletal system - Plan: PT plan of care cert/re-cert     Problem List Patient Active Problem List   Diagnosis Date Noted  . 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 [place], [place], [place] Phone: [phone]   Fax:  [phone]  Name: RENOLD KOZAR MRN: Gary Townsend Date of Birth: [birth year]

## 2019-03-17 ENCOUNTER — Other Ambulatory Visit: Payer: Self-pay

## 2019-03-17 ENCOUNTER — Ambulatory Visit (INDEPENDENT_AMBULATORY_CARE_PROVIDER_SITE_OTHER): Payer: BC Managed Care – PPO | Admitting: Physical Therapy

## 2019-03-17 ENCOUNTER — Encounter: Payer: Self-pay | Admitting: Physical Therapy

## 2019-03-17 DIAGNOSIS — R29898 Other symptoms and signs involving the musculoskeletal system: Secondary | ICD-10-CM | POA: Diagnosis not present

## 2019-03-17 DIAGNOSIS — M25561 Pain in right knee: Secondary | ICD-10-CM

## 2019-03-17 NOTE — Therapy (Signed)
[facility] [place] [address] [place], [place], [address] Phone: [phone]   Fax:  [phone]  Physical Therapy Treatment  Patient Details  Name: Gary Townsend MRN: [id number] Date of Birth: 20-Jun-1990 Referring Provider (PT): Rodolph Bong, MD   Encounter Date: 03/17/2019  PT End of Session - 03/17/19 1155    Visit Number  2    Number of Visits  12    Date for PT Re-Evaluation  04/22/19    PT Start Time  1100    PT Stop Time  1140    PT Time Calculation (min)  40 min    Activity Tolerance  Patient tolerated treatment well    Behavior During Therapy  [facility] for tasks assessed/performed       Past Medical History:  Diagnosis Date  . Closed dislocation of right patella 2011    History reviewed. No pertinent surgical history.  There were no vitals filed for this visit.  Subjective Assessment - 03/17/19 1101    Subjective  knee is feeling better.  having some occasional episodes of pain which is less frequent and less intense; only lasting 10-15 min.    Limitations  Walking    How long can you walk comfortably?  difficulty with sharp turns    Patient Stated Goals  improve pain, reduce risk of subluxation    Pain Score  0-No pain   up to 4/10   Pain Onset  1 to 4 weeks ago                       [facility] Adult PT Treatment/Exercise - 03/17/19 1104      Knee/Hip Exercises: Aerobic   Recumbent Bike  L2 x 6 min      Knee/Hip Exercises: Seated   Long Arc Quad  Right;2 sets;10 reps;Weights    Long Arc Quad Weight  2 lbs.    Long [employer] Limitations  with ball squeeze for VMO activation      Knee/Hip Exercises: Supine   Single Leg Bridge  Right;2 sets;10 reps   5 sec hold   Straight Leg Raise with External Rotation  Right;2 sets;10 reps    Straight Leg Raise with External Rotation Limitations  2#      Knee/Hip Exercises: Sidelying   Hip ABduction  Right;2 sets;10 reps    Hip ABduction Limitations   with extension at end range; 2#      Knee/Hip Exercises: Prone   Hip Extension  Right;2 sets;10 reps    Hip Extension Limitations  with 90 deg flexion             PT Education - 03/17/19 1155    Education Details  HEP    Person(s) Educated  Patient    Methods  Explanation;Demonstration;Handout    Comprehension  Verbalized understanding;Returned demonstration;Need further instruction          PT Long Term Goals - 03/11/19 1236      PT LONG TERM GOAL #1   Title  independent with HEP    Status  New    Target Date  04/22/19      PT LONG TERM GOAL #2   Title  FOTO score improved to 16% limited for improved function    Status  New    Target Date  04/22/19      PT LONG TERM GOAL #3   Title  demonstrate at least 4+/5 RLE strength for improved function  Status  New    Target Date  04/22/19      PT LONG TERM GOAL #4   Title  report no episodes of subluxation for at least 2 weeks for improved function    Status  New    Target Date  04/22/19      PT LONG TERM GOAL #5   Title  report pain < 4/10 with activity for improved function    Status  New    Target Date  04/22/19            Plan - 03/17/19 1156    Clinical Impression Statement  Pt reporting improvement in pain and functional mobility with decreased intensity and frequency.  Difficulty with strengthening exercises especially glutes.  Will continue to benefit from PT to maximize function.    Examination-Activity Limitations  Locomotion Level    Examination-Participation Restrictions  Community Activity;Other   work   Stability/Clinical Decision Making  Stable/Uncomplicated    Rehab Potential  Good    PT Frequency  2x / week    PT Duration  6 weeks    PT Treatment/Interventions  ADLs/Self Care Home Management;Cryotherapy;Electrical Stimulation;Ultrasound;Moist Heat;Iontophoresis 4mg /ml Dexamethasone;Stair training;Gait training;Functional mobility training;Therapeutic activities;Therapeutic  exercise;Patient/family education;Manual techniques;Passive range of motion;Vasopneumatic Device;Taping;Dry needling    PT Next Visit Plan  review HEP, progress VMO and glute med strengthening    PT Home Exercise Plan  Access Code: [email or web address]    Consulted and Agree with Plan of Care  Patient       Patient will benefit from skilled therapeutic intervention in order to improve the following deficits and impairments:  Abnormal gait, Difficulty walking, Increased fascial restricitons, Increased muscle spasms, Pain, Hypermobility, Decreased strength  Visit Diagnosis: Acute pain of right knee  Other symptoms and signs involving the musculoskeletal system     Problem List Patient Active Problem List   Diagnosis Date Noted  . 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

## 2019-03-17 NOTE — Patient Instructions (Signed)
Access Code: [email or web address]  URL: [email or web address]  Date: 03/17/2019  Prepared by: Faustino Congress   Exercises  Straight Leg Raise with External Rotation - 10 reps - 1 sets - 2-3 sec hold - 3x daily - 7x weekly  Seated Long Arc Quad with Hip Adduction - 10 reps - 1 sets - 2-3 sec hold - 3x daily - 7x weekly  Sidelying Hip Extension in Abduction - 10 reps - 1 sets - 2-3 sec hold - 3x daily - 7x weekly  Prone Quadriceps Stretch with Strap - 3 reps - 1 sets - 30 sec hold - 2x daily - 7x weekly  Prone Hip Extension with Bent Knee - 10 reps - 1 sets - 2-3 sec hold - 2x daily - 7x weekly  Single Leg Bridge - 10 reps - 1 sets - 5 sec hold - 2x daily - 7x weekly  Patient Education  Trigger Point Dry Needling

## 2019-03-19 ENCOUNTER — Encounter: Payer: Self-pay | Admitting: Family Medicine

## 2019-03-19 ENCOUNTER — Ambulatory Visit (INDEPENDENT_AMBULATORY_CARE_PROVIDER_SITE_OTHER): Payer: BC Managed Care – PPO | Admitting: Physical Therapy

## 2019-03-19 ENCOUNTER — Ambulatory Visit (INDEPENDENT_AMBULATORY_CARE_PROVIDER_SITE_OTHER): Payer: BC Managed Care – PPO | Admitting: Family Medicine

## 2019-03-19 ENCOUNTER — Other Ambulatory Visit: Payer: Self-pay

## 2019-03-19 VITALS — BP 115/76 | HR 67 | Wt 207.0 lb

## 2019-03-19 DIAGNOSIS — M25561 Pain in right knee: Secondary | ICD-10-CM

## 2019-03-19 DIAGNOSIS — S83001A Unspecified subluxation of right patella, initial encounter: Secondary | ICD-10-CM

## 2019-03-19 DIAGNOSIS — R29898 Other symptoms and signs involving the musculoskeletal system: Secondary | ICD-10-CM | POA: Diagnosis not present

## 2019-03-19 NOTE — Progress Notes (Signed)
   Gary Townsend is a 28 y.o. male who presents to [facility] today for follow-up right knee pain.  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  He has been receiving physical therapy and notes considerable symptom improvement.  He is back at work no longer requiring a brace and is happy with how things are going.'  ROS:  As above  Exam:  BP 115/76   Pulse 67   Wt 207 lb (93.9 kg)   BMI 32.42 kg/m  Wt Readings from Last 5 Encounters:  03/19/19 207 lb (93.9 kg)  03/05/19 213 lb (96.6 kg)  02/05/19 208 lb (94.3 kg)   General: Well Developed, well nourished, and in no acute distress.  Neuro/Psych: Alert and oriented x3, extra-ocular muscles intact, able to move all 4 extremities, sensation grossly intact. Skin: Warm and dry, no rashes noted.  Respiratory: Not using accessory muscles, speaking in full sentences, trachea midline.  Cardiovascular: Pulses palpable, no extremity edema. Abdomen: Does not appear distended. MSK: Right knee normal-appearing nontender normal motion.  Stable given his exam negative patellar apprehension test.    Lab and Radiology Results No results found for this or any previous visit (from the past 72 hour(s)). No results found.     Assessment and Plan: 28 y.o. male with knee pain: Significant improvement.  Continue physical therapy and home exercise program.  Recheck as needed.  I spent 15 minutes with this patient, greater than 50% was face-to-face time counseling regarding next steps and treatment plan and backup plan.Marland Kitchen  PDMP not reviewed this encounter. No orders of the defined types were placed in this encounter.  No orders of the defined types were placed in this encounter.   Historical information moved to improve visibility of documentation.  Past Medical History:  Diagnosis Date  . Closed  dislocation of right patella 2011   No past surgical history on file. Social History   Tobacco Use  . Smoking status: Never Smoker  . Smokeless tobacco: Never Used  Substance Use Topics  . Alcohol use: No   family history includes Healthy in his father and mother.  Medications: Current Outpatient Medications  Medication Sig Dispense Refill  . diclofenac sodium (VOLTAREN) 1 % GEL Apply 4 g topically 4 (four) times daily. To affected joint. 100 g 11  . naproxen (NAPROSYN) 500 MG tablet Take 1 tablet (500 mg total) by mouth 2 (two) times daily as needed for moderate pain. 60 tablet 1   No current facility-administered medications for this visit.    No Known Allergies    Discussed warning signs or symptoms. Please see discharge instructions. Patient expresses understanding.

## 2019-03-19 NOTE — Patient Instructions (Signed)
Thank you for coming in today. Continue home exercises and PT.  If not improving let me know. We can proceed with MRI or injection.

## 2019-03-19 NOTE — Therapy (Signed)
[facility] [place], [place], [address] Phone: [phone]   Fax:  [phone]  Physical Therapy Treatment  Patient Details  Name: Gary Townsend MRN: [id number] Date of Birth: Nov 30, 1990 Referring Provider (PT): Gregor Hams, MD   Encounter Date: 03/19/2019  PT End of Session - 03/19/19 1352    Visit Number  3    Number of Visits  12    Date for PT Re-Evaluation  04/22/19    PT Start Time  2831    PT Stop Time  1428    PT Time Calculation (min)  39 min    Activity Tolerance  Patient tolerated treatment well       Past Medical History:  Diagnosis Date  . Closed dislocation of right patella 2011    No past surgical history on file.  There were no vitals filed for this visit.  Subjective Assessment - 03/19/19 1352    Subjective  Pt reports his knee has been much better than when he started therapy. Hasn't worn brace in 1.5 wk and also hasn't had any subluxation in that time. He states his fiance' makes him do exercises daily.    Currently in Pain?  No/denies    Pain Score  0-No pain         [facility] PT Assessment - 03/19/19 0001      Assessment   Medical Diagnosis  M25.561 (ICD-10-CM) - Acute pain of right knee    Referring Provider (PT)  Gregor Hams, MD    Onset Date/Surgical Date  03/01/19    Hand Dominance  Left   Rt footed   Next MD Visit  03/19/2019    Prior Therapy  none      Strength   Right Hip Extension  4+/5    Right Hip External Rotation   4/5    Right Hip Internal Rotation  5/5    Right Hip ABduction  4+/5    Right Hip ADduction  4+/5    Right/Left Knee  Right    Right Knee Flexion  4+/5        [facility] Adult PT Treatment/Exercise - 03/19/19 0001      Self-Care   Self-Care  Other Self-Care Comments;Posture    Posture  Pt educated to unlock hyperextended knee while in standing to reduce pain and reinjury.  pt returned demo.     Other Self-Care Comments   Pt educated in self  massage with roller stick to lateral quad to decrease fascial restrictions; pt returned demo with cues.       Knee/Hip Exercises: Stretches   Gastroc Stretch  Right;Left;2 reps;20 seconds      Knee/Hip Exercises: Seated   Long Arc Quad  Right;2 sets;10 reps;Weights    Long Arc Quad Weight  2 lbs.    Long [employer] Limitations  with ball squeeze for VMO activation      Knee/Hip Exercises: Supine   Single Leg Bridge  Right;1 set   5 sec hold in ext   Straight Leg Raise with External Rotation  Strengthening;Right;1 set;10 reps   long sitting    Straight Leg Raise with External Rotation Limitations  2nd set with hip abd/add       Knee/Hip Exercises: Sidelying   Hip ABduction  Right;2 sets;10 reps    Hip ABduction Limitations  with extension at end range; 2#      Knee/Hip Exercises: Prone   Hip Extension  Right;2 sets;10 reps  Hip Extension Limitations  with 90 deg flexion                  PT Long Term Goals - 03/11/19 1236      PT LONG TERM GOAL #1   Title  independent with HEP    Status  New    Target Date  04/22/19      PT LONG TERM GOAL #2   Title  FOTO score improved to 16% limited for improved function    Status  New    Target Date  04/22/19      PT LONG TERM GOAL #3   Title  demonstrate at least 4+/5 RLE strength for improved function    Status  New    Target Date  04/22/19      PT LONG TERM GOAL #4   Title  report no episodes of subluxation for at least 2 weeks for improved function    Status  New    Target Date  04/22/19      PT LONG TERM GOAL #5   Title  report pain < 4/10 with activity for improved function    Status  New    Target Date  04/22/19            Plan - 03/19/19 1415    Clinical Impression Statement  Pt demonstrated some improvement in Rt hip strength.  Pt reporting decrease in frequence of knee pain and instability since initiating therapy.  Pt progressing towards goals; will benefit from continued PT intervention to maximize  function and reduce reinjury.    Examination-Activity Limitations  Locomotion Level    Examination-Participation Restrictions  Community Activity;Other   work   Stability/Clinical Decision Making  Stable/Uncomplicated    Rehab Potential  Good    PT Frequency  2x / week    PT Duration  6 weeks    PT Treatment/Interventions  ADLs/Self Care Home Management;Cryotherapy;Electrical Stimulation;Ultrasound;Moist Heat;Iontophoresis 4mg /ml Dexamethasone;Stair training;Gait training;Functional mobility training;Therapeutic activities;Therapeutic exercise;Patient/family education;Manual techniques;Passive range of motion;Vasopneumatic Device;Taping;Dry needling    PT Next Visit Plan  add hip ER and standing quad strengthening. Add ITB stretch to HEP    PT Home Exercise Plan  Access Code: [email or web address]    Consulted and Agree with Plan of Care  Patient       Patient will benefit from skilled therapeutic intervention in order to improve the following deficits and impairments:  Abnormal gait, Difficulty walking, Increased fascial restricitons, Increased muscle spasms, Pain, Hypermobility, Decreased strength  Visit Diagnosis: Acute pain of right knee  Other symptoms and signs involving the musculoskeletal system     Problem List Patient Active Problem List   Diagnosis Date Noted  . 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, [place], [place] Phone: [phone]   Fax:  [phone]  Name: Gary Townsend MRN: Frazier Butt Date of Birth: 1990-07-29

## 2019-03-24 ENCOUNTER — Other Ambulatory Visit: Payer: Self-pay

## 2019-03-24 ENCOUNTER — Encounter: Payer: Self-pay | Admitting: Physical Therapy

## 2019-03-24 ENCOUNTER — Ambulatory Visit (INDEPENDENT_AMBULATORY_CARE_PROVIDER_SITE_OTHER): Payer: BC Managed Care – PPO | Admitting: Physical Therapy

## 2019-03-24 DIAGNOSIS — R29898 Other symptoms and signs involving the musculoskeletal system: Secondary | ICD-10-CM

## 2019-03-24 DIAGNOSIS — M25561 Pain in right knee: Secondary | ICD-10-CM | POA: Diagnosis not present

## 2019-03-24 NOTE — Therapy (Addendum)
[facility] [place], [place], [address] Phone: [phone]   Fax:  [phone]  Physical Therapy Treatment/Discharge Summary  Patient Details  Name: Gary Townsend MRN: [id number] Date of Birth: 1990-12-22 Referring Provider (PT): Gregor Hams, MD   Encounter Date: 03/24/2019  PT End of Session - 03/24/19 1149    Visit Number  4    PT Start Time  1104    PT Stop Time  1142    PT Time Calculation (min)  38 min    Activity Tolerance  Patient tolerated treatment well    Behavior During Therapy  [facility], [facility] for tasks assessed/performed       Past Medical History:  Diagnosis Date  . Closed dislocation of right patella 2011    History reviewed. No pertinent surgical history.  There were no vitals filed for this visit.  Subjective Assessment - 03/24/19 1103    Subjective  Pt reports that his knee has been doing well and has not had a knee subluxation in over 2 wks.    Currently in Pain?  No/denies    Aggravating Factors   sharp turns    Pain Relieving Factors  ice        [facility] Adult PT Treatment/Exercise - 03/24/19 0001      Knee/Hip Exercises: Stretches   quad Stretch  Right;1 rep;60 seconds   Prone w/ strap and noodle above knee   ITB Stretch  Right;Left;2 reps;20 seconds -standing    Gastroc Stretch  Right;Left;2 reps;20 seconds      Knee/Hip Exercises: Aerobic   Recumbent Bike  L3: 6 min      Knee/Hip Exercises: Standing   Step Down  Right;1 set;10 reps - Lt heel tap, 3" step   Step Down Limitations  VCs for technique and alignment   Other Standing Knee Exercises  Wall slides 10 reps x 5sec w/ ball squeeze    Other Standing Knee Exercises  Single leg squat w/ opposite leg abduction 10 reps      Knee/Hip Exercises: Supine   Bridges  --    Bridges Limitations  --    Single Leg Bridge  Strengthening;Right;1 set;10 reps   5 sec hold with foot on airex pad     Manual Therapy   Manual Therapy  Soft  tissue mobilization    Soft tissue mobilization  STM to R lateral quad       PT Education - 03/24/19 1157    Education Details  HEP    Person(s) Educated  Patient    Methods  Explanation;Demonstration;Handout    Comprehension  Returned demonstration;Verbal cues required;Verbalized understanding          PT Long Term Goals - 03/11/19 1236      PT LONG TERM GOAL #1   Title  independent with HEP    Status  New    Target Date  04/22/19      PT LONG TERM GOAL #2   Title  FOTO score improved to 16% limited for improved function    Status  New    Target Date  04/22/19      PT LONG TERM GOAL #3   Title  demonstrate at least 4+/5 RLE strength for improved function    Status  New    Target Date  04/22/19      PT LONG TERM GOAL #4   Title  report no episodes of subluxation for at least 2 weeks for improved function  Status  New    Target Date  04/22/19      PT LONG TERM GOAL #5   Title  report pain < 4/10 with activity for improved function    Status  New    Target Date  04/22/19            Plan - 03/24/19 1150    Clinical Impression Statement  Pt tolerated treatment well with no reports of pain during exercise. Pt reporting 95% improvement; near meeting his goals. Pt will benefit from further PT to improve funcitonal mobility.    Examination-Activity Limitations  Locomotion Level    Examination-Participation Restrictions  Community Activity;Other    Stability/Clinical Decision Making  Stable/Uncomplicated    Clinical Decision Making  Low    Rehab Potential  Good    PT Frequency  2x / week    PT Treatment/Interventions  ADLs/Self Care Home Management;Cryotherapy;Electrical Stimulation;Ultrasound;Moist Heat;Iontophoresis 27m/ml Dexamethasone;Stair training;Gait training;Functional mobility training;Therapeutic activities;Therapeutic exercise;Patient/family education;Manual techniques;Passive range of motion;Vasopneumatic Device;Taping;Dry needling    PT Next Visit  Plan  Assess goals and readiness to d/c vs hold.    PT Home Exercise Plan  Access Code: [email or web address]      Patient will benefit from skilled therapeutic intervention in order to improve the following deficits and impairments:  Abnormal gait, Difficulty walking, Increased fascial restricitons, Increased muscle spasms, Pain, Hypermobility, Decreased strength  Visit Diagnosis: Acute pain of right knee  Other symptoms and signs involving the musculoskeletal system     Problem List Patient Active Problem List   Diagnosis Date Noted  . Acquired subluxation of right patella 03/05/2019    KGardiner Rhyme SPTA 03/24/2019, 12:40 PM   JKerin Perna PTA 03/24/19 12:54 PM   [facility] [place] [address]Phone: [phone]  Fax:  [phone] Name: Gary Townsend: [id number] of Birth: 807-Oct-1992      PHYSICAL THERAPY DISCHARGE SUMMARY  Visits from Start of Care: 4  Current functional level related to goals / functional outcomes: See above   Remaining deficits: Unknown; pt did not return   Education / Equipment: HEP  Plan: Patient agrees to discharge.  Patient goals were not met. Patient is being discharged due to not returning since the last visit.  ?????     SLaureen Abrahams PT, DPT 04/29/19 9:24 AM  [facility] [place] [address] [phone](office) [phone](fax)

## 2019-03-27 ENCOUNTER — Encounter: Payer: BC Managed Care – PPO | Admitting: Physical Therapy

## 2019-03-31 ENCOUNTER — Encounter: Payer: BC Managed Care – PPO | Admitting: Physical Therapy

## 2019-03-31 ENCOUNTER — Telehealth: Payer: Self-pay | Admitting: Physical Therapy

## 2019-03-31 NOTE — Telephone Encounter (Signed)
Patient did not show for PT appt.  Called and left HIPAA compliant message on voicemail for him to return phone call to [phone].   Kerin Perna, PTA 03/31/19 11:17 AM

## 2019-04-03 ENCOUNTER — Telehealth: Payer: Self-pay | Admitting: Rehabilitative and Restorative Service Providers"

## 2019-04-03 ENCOUNTER — Encounter: Payer: BC Managed Care – PPO | Admitting: Rehabilitative and Restorative Service Providers"

## 2019-04-03 NOTE — Telephone Encounter (Signed)
Attempted to contact patient due to no show. No answer.

## 2020-01-14 DIAGNOSIS — L02811 Cutaneous abscess of head [any part, except face]: Secondary | ICD-10-CM | POA: Diagnosis not present

## 2021-03-17 IMAGING — DX DG KNEE COMPLETE 4+V*L*
6 series · 6 of 6 positions shown · non-contrast
Comparison: 03/30/2016.

CLINICAL DATA: Bilateral knee pain and tenderness.

EXAM:
LEFT KNEE - COMPLETE 4+ VIEW

[knee ap (1 of 2)]
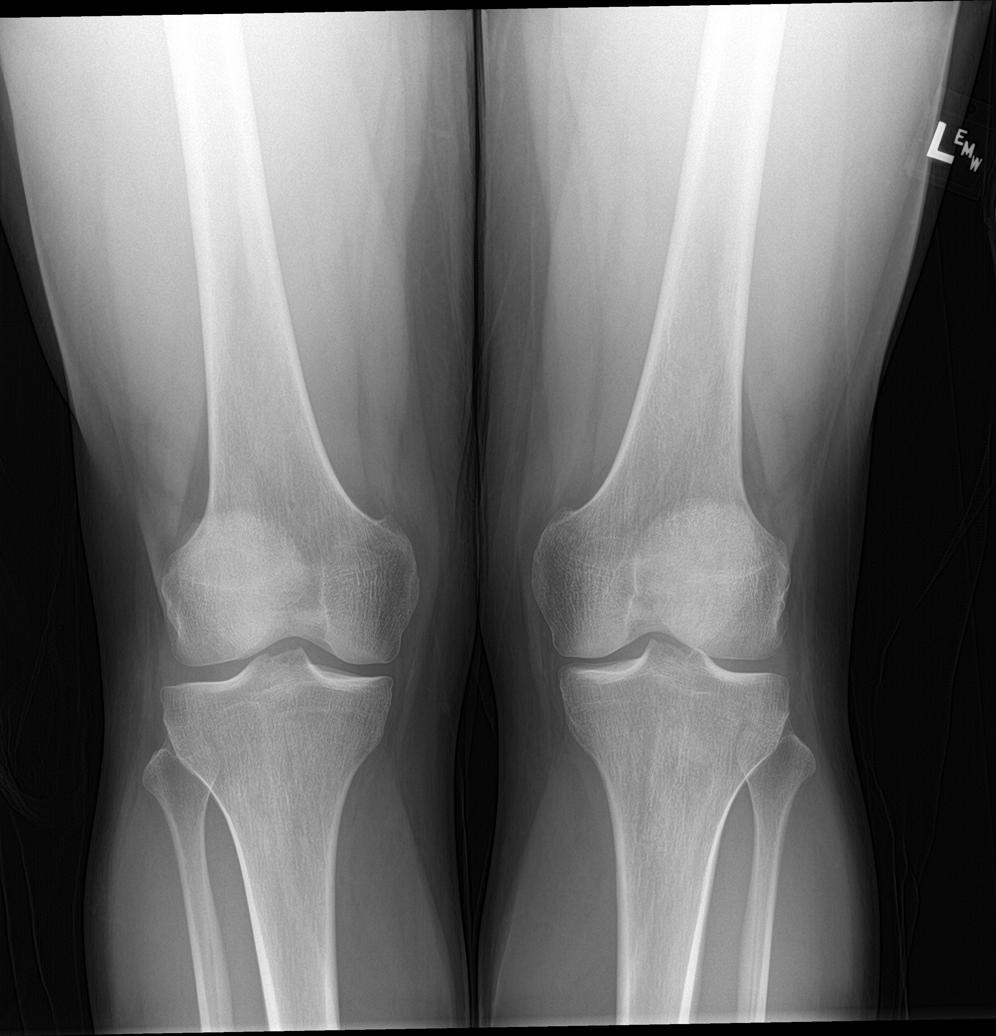

[knee ap (2 of 2)]
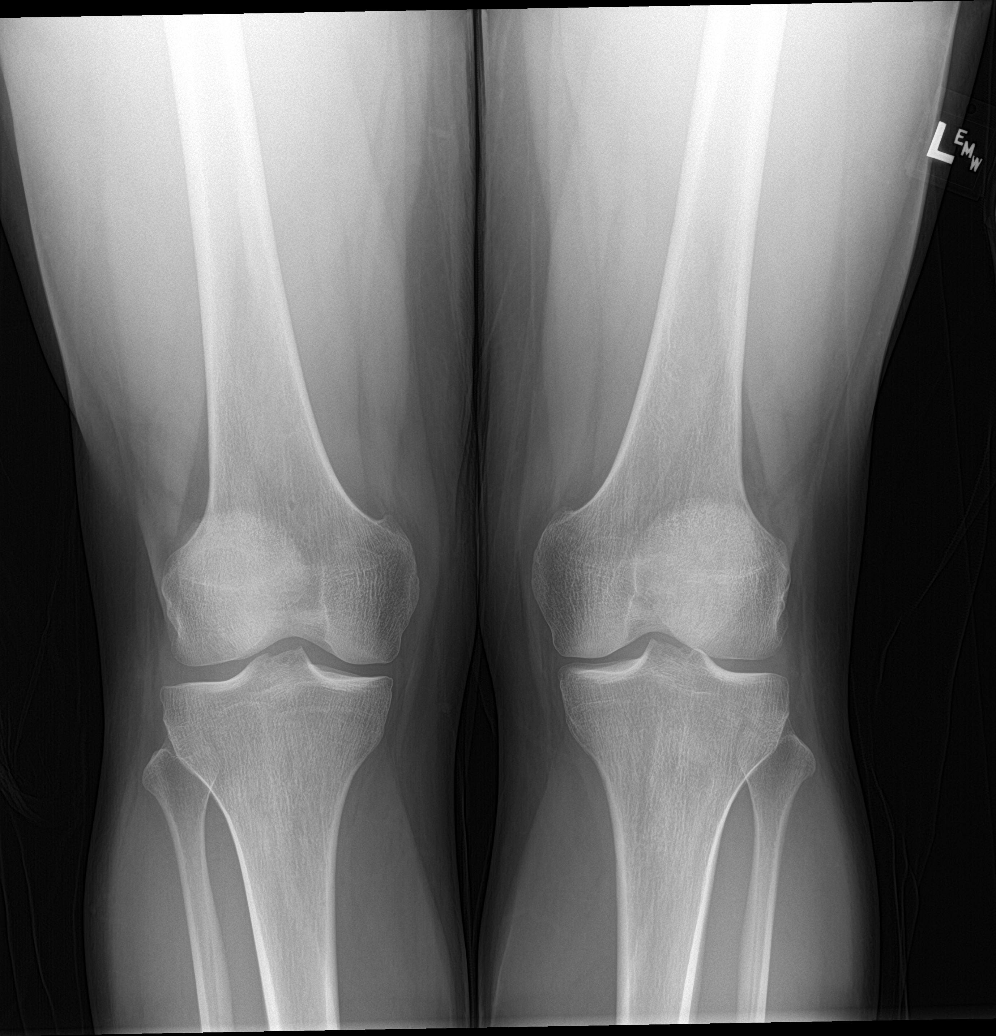

[tunnel]
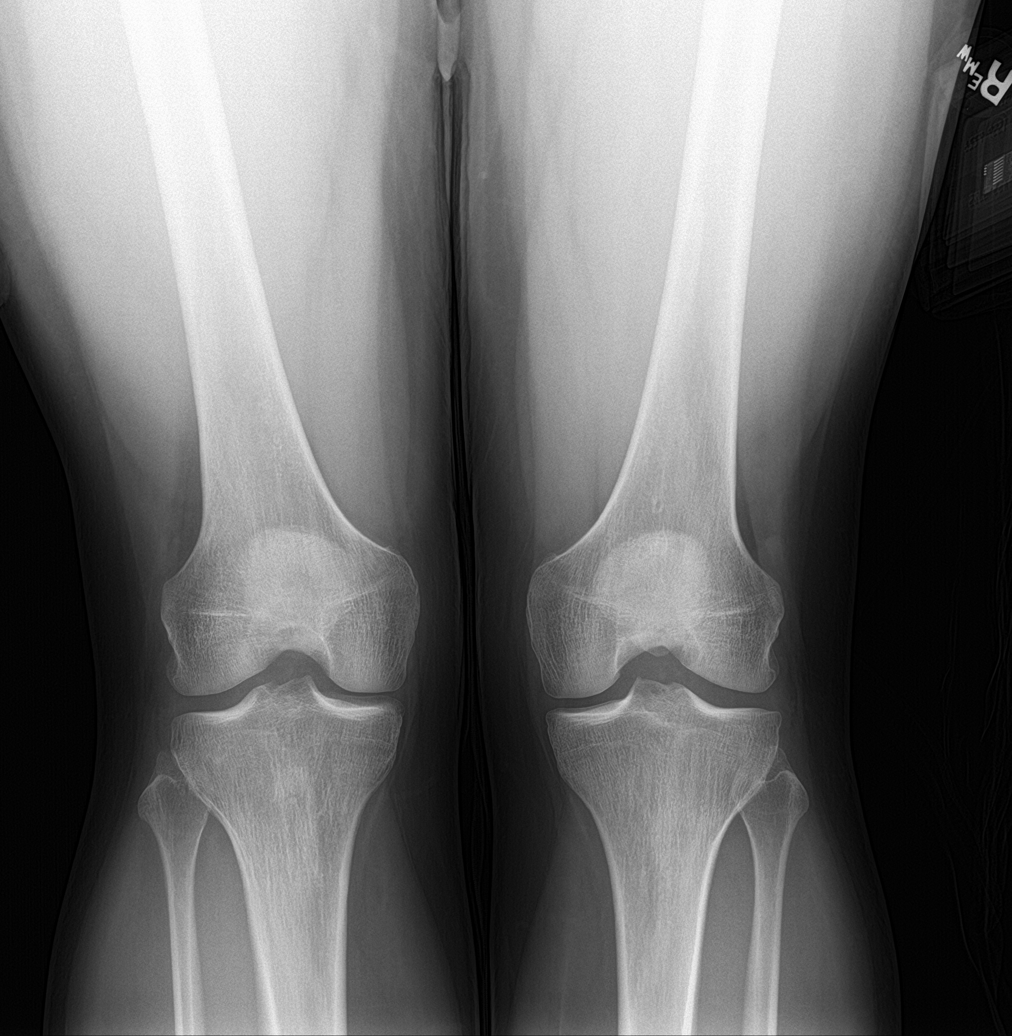

[knee lat (1 of 2)]
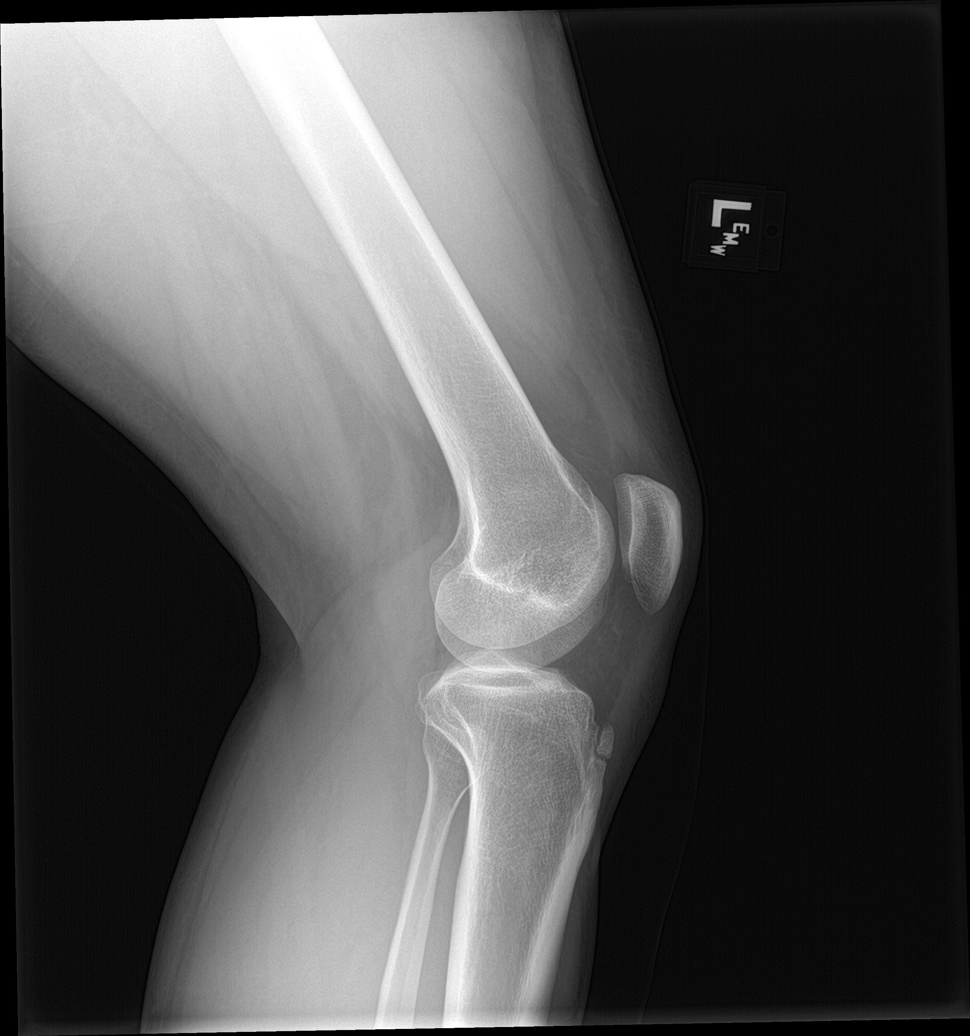

[knee lat (2 of 2)]
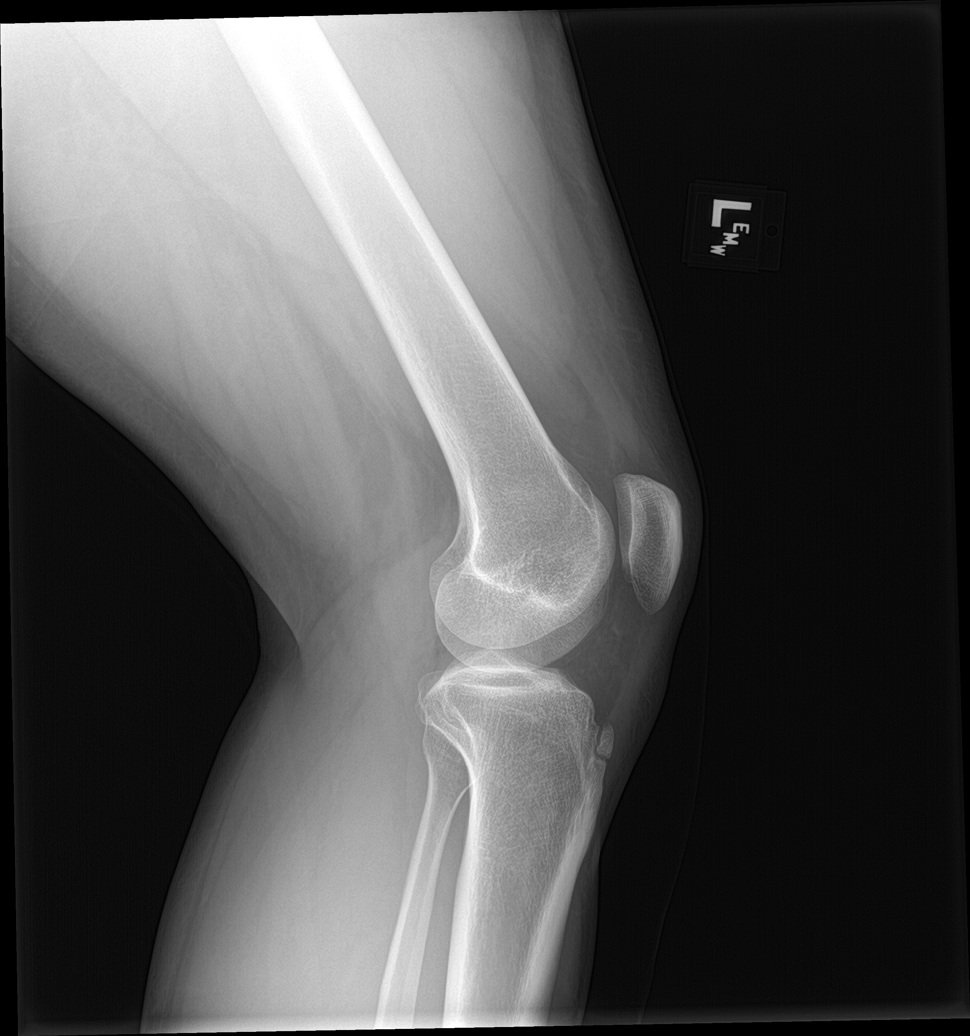

[knee sunrise]
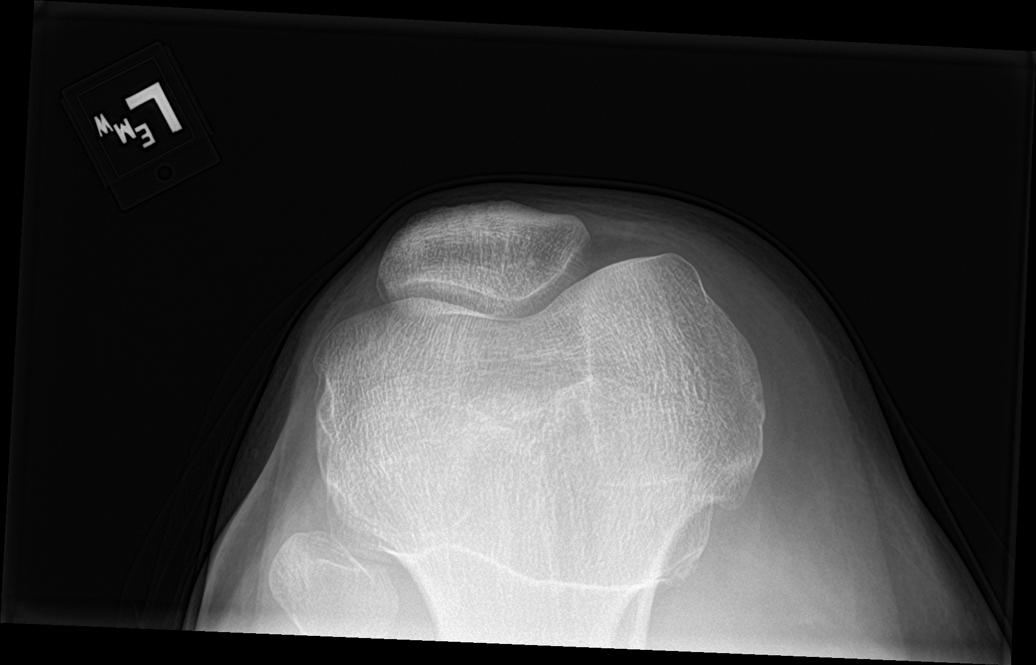

[6 of 6 positions shown; findings below may reference images not displayed]

FINDINGS: No acute bony or joint abnormality. No evidence of fracture or
dislocation. No evidence of effusion.
IMPRESSION: No acute abnormality.
